# Patient Record
Sex: Male | Born: 1955 | Race: White | Hispanic: No | Marital: Married | Smoking: Never smoker
Health system: Southern US, Community
[De-identification: ages and names within clinical notes are randomized; demographics above are authoritative.]

## PROBLEM LIST (undated history)

## (undated) DIAGNOSIS — K219 Gastro-esophageal reflux disease without esophagitis: Secondary | ICD-10-CM

## (undated) DIAGNOSIS — R1013 Epigastric pain: Secondary | ICD-10-CM

## (undated) DIAGNOSIS — K449 Diaphragmatic hernia without obstruction or gangrene: Secondary | ICD-10-CM

## (undated) DIAGNOSIS — K5792 Diverticulitis of intestine, part unspecified, without perforation or abscess without bleeding: Secondary | ICD-10-CM

---
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
## 1989-01-23 HISTORY — PX: KNEE SURGERY: SHX244

## 2005-08-19 ENCOUNTER — Ambulatory Visit: Payer: Self-pay | Admitting: Family Medicine

## 2007-06-09 ENCOUNTER — Emergency Department: Payer: Self-pay | Admitting: Emergency Medicine

## 2008-11-04 ENCOUNTER — Emergency Department: Payer: Self-pay

## 2010-05-25 HISTORY — PX: INGUINAL HERNIA REPAIR: SHX194

## 2010-12-03 ENCOUNTER — Ambulatory Visit: Payer: Self-pay | Admitting: Surgery

## 2012-11-28 ENCOUNTER — Ambulatory Visit: Payer: Self-pay | Admitting: Orthopedic Surgery

## 2013-01-25 ENCOUNTER — Ambulatory Visit: Payer: Self-pay | Admitting: Orthopedic Surgery

## 2013-01-25 LAB — BASIC METABOLIC PANEL
Anion Gap: 5 — ABNORMAL LOW (ref 7–16)
BUN: 12 mg/dL (ref 7–18)
Calcium, Total: 9.1 mg/dL (ref 8.5–10.1)
Co2: 30 mmol/L (ref 21–32)
EGFR (Non-African Amer.): >60
Glucose: 84 mg/dL (ref 65–99)
Osmolality: 278 (ref 275–301)
Sodium: 140 mmol/L (ref 136–145)

## 2013-01-25 LAB — CBC
HCT: 40.5 % (ref 40.0–52.0)
HGB: 14 g/dL (ref 13.0–18.0)
MCH: 29.6 pg (ref 26.0–34.0)
MCHC: 34.6 g/dL (ref 32.0–36.0)
RDW: 13.3 % (ref 11.5–14.5)

## 2013-01-25 LAB — PROTIME-INR
INR: 1
Prothrombin Time: 13.5 secs (ref 11.5–14.7)

## 2013-02-01 ENCOUNTER — Ambulatory Visit: Payer: Self-pay | Admitting: Orthopedic Surgery

## 2013-05-25 HISTORY — PX: KNEE SURGERY: SHX244

## 2014-06-05 ENCOUNTER — Ambulatory Visit: Payer: Self-pay | Admitting: Surgery

## 2014-06-05 HISTORY — PX: INGUINAL HERNIA REPAIR: SUR1180

## 2014-07-19 ENCOUNTER — Ambulatory Visit: Payer: Self-pay | Admitting: Urgent Care

## 2014-09-14 NOTE — Op Note (Signed)
PATIENT NAME:  Carl Mckenzie, Carl Mckenzie MR#:  [id number] DATE OF BIRTH:  04-15-1956  DATE OF PROCEDURE:  02/01/2013  PREOPERATIVE DIAGNOSIS: Right knee medial meniscus tear.   POSTOPERATIVE DIAGNOSIS: Right knee medial meniscus tear, tear of the posterior root of the lateral meniscus, diffuse synovitis and grade 2 to 3 chondral lesion of the medial femoral condyle.   PROCEDURES: Right knee arthroscopic partial medial meniscectomy, partial lateral meniscectomy, chondroplasty of the medial femoral condyle and partial synovectomy.   SURGEON: Thornton Park, M.D.   ANESTHESIA: General.   ESTIMATED BLOOD LOSS: Minimal.   COMPLICATIONS: None.   INDICATIONS FOR PROCEDURE: The patient is a 59 year old male who has had persistent mechanical symptoms, despite conservative management. An MRI had confirmed the presence of a significant medial meniscus tear with chondromalacia of the medial femoral condyle. The patient, given his persistent symptoms has decided to proceed with arthroscopic surgery. I reviewed the risks and benefits of surgery with the patient prior to the date of surgery. He understands the risks include infection, bleeding, nerve or blood vessel injury, ligament injury, knee stiffness, persistent pain or instability, osteoarthritis, and the need for further surgery. Medical risks include, but are not limited to DVT and pulmonary embolism, myocardial infarction, stroke, pneumonia, respiratory failure and death. The patient understood these risks and wished to proceed.   PROCEDURE: The patient was met in the preoperative area. I marked the right knee within the operative field according to hospital's right site protocol. This was after verbally confirming with the patient that this was the correct site of surgery. I also referred to my office notes and the patient's radiographic studies to confirm the side of surgery.    The patient was then brought to the operating room. He was placed supine on the  operative table. All bony prominences were adequately padded. He underwent general anesthesia. The patient was then prepped and draped in sterile fashion.   A timeout was performed to verify the patient's name, date of birth, medical record number, correct site of surgery, and correct procedure to be performed. It was also used to verify the patient had received antibiotics and all appropriate instruments, implants, and radiographic studies were available in the room. Once all in attendance were in agreement, the case began.   Examination under anesthesia revealed no ligamentous laxity on both Lachman's test, anterior posterior drawer testing or varus or valgus stress testing at 0 and 30 degrees of knee flexion. He had a full range of motion from 0 to 130 degrees.   The proposed arthroscopy incisions were drawn out with a surgical marker and pre- injected with 1% lidocaine plain. Both the inferior medial and lateral incisions were made with an 11 blade. The inferomedial portal was made under direct visualization after the arthroscope was placed into the inferolateral portal.   A full diagnostic examination of the knee was undertaken. This included the suprapatellar pouch, patellofemoral joint, medial and lateral gutters, as well as medial and lateral compartments, the intercondylar notch and the posterior knee. Findings on arthroscopy included a complex tear of the posterior horn of the medial meniscus, grade 2 to 3 lesion of the medial femoral condyle, extensive synovitis throughout the knee, as well as a tear, without avulsion, of the lateral posterior root of the meniscus.   A straight basket as well as 4 - 0 resector shaver blade were used to debride the medial horn of the meniscus. The 4-0  resector shaver blade was used to contour the meniscus following debridement to  avoid any sharp angles which may be in location for further tear. Once the medial meniscus was addressed, the patient's leg was placed  in a figure four position to allow for better access to the posterior root of the lateral meniscus. This too was debrided using a 4 - 0 resector shaver blade and straight basket. The tear was removed without detaching the posterior root of the lateral meniscus. There was fraying of the inner margin of the lateral meniscus as well which was debrided and contoured with a 4 - 0 resector shaver blade.   The patient had extensive synovitis which was debrided with a 90 degree ArthroCare wand. This included both the medial and lateral anterior knee as well as the gutters and suprapatellar pouch.  Finally, the medial femoral condyle underwent a chondroplasty to remove all loose piece of cartilage to avoid chondral loose bodies. The knee was then copiously irrigated. All arthroscopic instruments were removed. The patient had 4-0 nylon sutures placed in his arthroscopy incisions. A dry sterile and compressive dressing was placed over the right knee. He was then transferred to a hospital stretcher and brought to the PAC-U in stable condition. I was scrubbed and present for the entire case and all sharp and instrument counts were correct at the conclusion of the case. I spoke with the patient's wife following surgery to let her know the case had gone without complication. The patient was stable in the recovery room.   ____________________________ Kevin L. Krasinski, MD klk:sg D: 02/13/2013 08:37:18 ET T: 02/13/2013 09:06:47 ET JOB#: [id number]  cc: Kevin L. Krasinski, MD, <Dictator> KEVIN L KRASINSKI MD ELECTRONICALLY SIGNED 02/13/2013 18:10 

## 2014-09-23 NOTE — Op Note (Signed)
PATIENT NAME:  Carl Mckenzie, Carl Mckenzie MR#:  [id number] DATE OF BIRTH:  1955-11-17  DATE OF PROCEDURE:  06/05/2014  PREOPERATIVE DIAGNOSIS: Left inguinal hernia.   POSTOPERATIVE DIAGNOSIS: Left inguinal hernia.   PROCEDURE: Left inguinal hernia repair.   SURGEON: Rochel Brome, MD   ANESTHESIA: General.   INDICATIONS: This 59 year old male has history of bulging in the left groin. An inguinal hernia was demonstrated on physical exam and repair was recommended for definitive treatment.   DESCRIPTION OF PROCEDURE: The patient was placed on the operating table in the supine position under general anesthesia. The left lower abdomen was clipped and prepared with ChloraPrep and draped in a sterile manner.   A transversely oriented left lower quadrant suprapubic incision was made and carried down through subcutaneous tissues. Two traversing veins were divided between 4-0 chromic suture ligatures. Scarpa's fascia was incised. The external oblique aponeurosis was incised along the course of its fibers to open the external ring and expose the inguinal cord structures. The cord structures were mobilized. The cremaster fibers were spread to expose an indirect hernia sac, which was some 4-5 cm in length, and was dissected free from surrounding structures up to the internal ring. It was opened. Its continuity with the peritoneal cavity was demonstrated. A high ligation of the sac was done with a 4-0 Vicryl suture ligature and the sac was excised and the stump allowed to retract. Next, there was a cord lipoma which was approximately 5 cm in dimension which was dissected free from surrounding structures and followed up to the internal ring. A high ligation of the cord lipoma was done with a 0 Surgilon suture ligature and the lipoma was amputated. No tissues were submitted for pathology. The repair was carried out with a row of 0 Surgilon sutures beginning at the pubic tubercle and leading up to satisfactory narrowing of the  internal ring suturing the conjoined tendon to the shelving edge of the inguinal ligament incorporating transversalis fascia into the repair. A relaxing incision was made medially. An onlay Bard soft mesh was cut to create an oval shape of some 3 x 6 cm with a notch cut out to straddle the internal ring, and was sutured to the repair with interrupted 0 Surgilon,  sutured on both sides of the internal ring, and also medially and to the medial aspect of the relaxing incision. Hemostasis appeared to be intact. The cut edges of the external oblique aponeurosis were closed with a running 4-0 Vicryl suture. The deep fascia superior and lateral to the repair site was infiltrated with 0.5% Sensorcaine with epinephrine. Subcutaneous tissues were infiltrated. The Scarpa's fascia was closed with 4-0 Vicryl and 4-0 Monocryl. The skin was closed with running 4-0 Monocryl subcuticular suture and LiquiBand. The testicle remained in the scrotum. The patient appeared to tolerate the procedure satisfactorily and was then prepared for transfer to the recovery room.    ____________________________ Lenna Sciara. Rochel Brome, MD jws:bm D: 06/05/2014 16:23:55 ET T: 06/06/2014 00:42:14 ET JOB#: [id number]  cc: Loreli Dollar, MD, <Dictator> Loreli Dollar MD ELECTRONICALLY SIGNED 06/09/2014 10:42

## 2014-10-12 NOTE — Discharge Instructions (Signed)

## 2014-10-15 ENCOUNTER — Ambulatory Visit: Payer: PRIVATE HEALTH INSURANCE | Admitting: Student in an Organized Health Care Education/Training Program

## 2014-10-15 ENCOUNTER — Encounter
Admission: RE | Disposition: A | Discharge status: Home / Self Care | Payer: Self-pay | Source: Ambulatory Visit | Attending: Gastroenterology

## 2014-10-15 ENCOUNTER — Ambulatory Visit
Admission: RE | Admit: 2014-10-15 | Discharge: 2014-10-15 | Disposition: A | Discharge status: Home / Self Care | Payer: PRIVATE HEALTH INSURANCE | Source: Ambulatory Visit | Attending: Gastroenterology | Admitting: Gastroenterology

## 2014-10-15 ENCOUNTER — Other Ambulatory Visit: Payer: Self-pay | Admitting: Gastroenterology

## 2014-10-15 DIAGNOSIS — K449 Diaphragmatic hernia without obstruction or gangrene: Secondary | ICD-10-CM | POA: Insufficient documentation

## 2014-10-15 DIAGNOSIS — K2980 Duodenitis without bleeding: Secondary | ICD-10-CM | POA: Insufficient documentation

## 2014-10-15 DIAGNOSIS — K319 Disease of stomach and duodenum, unspecified: Secondary | ICD-10-CM | POA: Insufficient documentation

## 2014-10-15 DIAGNOSIS — K219 Gastro-esophageal reflux disease without esophagitis: Secondary | ICD-10-CM | POA: Diagnosis not present

## 2014-10-15 DIAGNOSIS — Z9889 Other specified postprocedural states: Secondary | ICD-10-CM | POA: Diagnosis not present

## 2014-10-15 DIAGNOSIS — K2970 Gastritis, unspecified, without bleeding: Secondary | ICD-10-CM | POA: Diagnosis not present

## 2014-10-15 DIAGNOSIS — Z7982 Long term (current) use of aspirin: Secondary | ICD-10-CM | POA: Insufficient documentation

## 2014-10-15 DIAGNOSIS — R1013 Epigastric pain: Secondary | ICD-10-CM | POA: Diagnosis present

## 2014-10-15 HISTORY — DX: Diverticulitis of intestine, part unspecified, without perforation or abscess without bleeding: K57.92

## 2014-10-15 HISTORY — DX: Gastro-esophageal reflux disease without esophagitis: K21.9

## 2014-10-15 HISTORY — DX: Diaphragmatic hernia without obstruction or gangrene: K44.9

## 2014-10-15 HISTORY — DX: Epigastric pain: R10.13

## 2014-10-15 HISTORY — PX: ESOPHAGOGASTRODUODENOSCOPY: SHX5428

## 2014-10-15 SURGERY — EGD (ESOPHAGOGASTRODUODENOSCOPY)
Anesthesia: Choice

## 2014-10-15 SURGERY — EGD (ESOPHAGOGASTRODUODENOSCOPY)
Anesthesia: Monitor Anesthesia Care | Wound class: Clean Contaminated

## 2014-10-15 MED ORDER — GLYCOPYRROLATE 0.2 MG/ML IJ SOLN
INTRAMUSCULAR | Status: DC | PRN
  Administered 2014-10-15: 0.1 mg via INTRAVENOUS

## 2014-10-15 MED ORDER — PROPOFOL 10 MG/ML IV BOLUS
INTRAVENOUS | Status: DC | PRN
  Administered 2014-10-15: 50 mg via INTRAVENOUS
  Administered 2014-10-15: 150 mg via INTRAVENOUS

## 2014-10-15 MED ORDER — LIDOCAINE HCL (CARDIAC) 20 MG/ML IV SOLN
INTRAVENOUS | Status: DC | PRN
  Administered 2014-10-15: 50 mg via INTRAVENOUS

## 2014-10-15 MED ORDER — LACTATED RINGERS IV SOLN
INTRAVENOUS | Status: DC
  Administered 2014-10-15 (×2): via INTRAVENOUS

## 2014-10-15 SURGICAL SUPPLY — 38 items
BALLN DILATOR 10-12 8 (BALLOONS)
BALLN DILATOR 12-15 8 (BALLOONS)
BALLN DILATOR 15-18 8 (BALLOONS)
BALLN DILATOR CRE 0-12 8 (BALLOONS)
BALLN DILATOR ESOPH 8 10 CRE (MISCELLANEOUS) IMPLANT
BALLOON DILATOR 12-15 8 (BALLOONS) IMPLANT
BALLOON DILATOR 15-18 8 (BALLOONS) IMPLANT
BALLOON DILATOR CRE 0-12 8 (BALLOONS) IMPLANT
BLOCK BITE 60FR ADLT L/F GRN (MISCELLANEOUS) ×3 IMPLANT
CANISTER SUCT 1200ML W/VALVE (MISCELLANEOUS) ×3 IMPLANT
FCP ESCP3.2XJMB 240X2.8X (MISCELLANEOUS)
FORCEPS BIOP RAD 4 LRG CAP 4 (CUTTING FORCEPS) ×3 IMPLANT
FORCEPS BIOP RJ4 240 W/NDL (MISCELLANEOUS)
FORCEPS ESCP3.2XJMB 240X2.8X (MISCELLANEOUS) IMPLANT
GOWN CVR UNV OPN BCK APRN NK (MISCELLANEOUS) ×2 IMPLANT
GOWN ISOL THUMB LOOP REG UNIV (MISCELLANEOUS) ×4
HEMOCLIP INSTINCT (CLIP) IMPLANT
INJECTOR VARIJECT VIN23 (MISCELLANEOUS) IMPLANT
KIT CO2 TUBING (TUBING) IMPLANT
KIT DEFENDO VALVE AND CONN (KITS) IMPLANT
KIT ENDO PROCEDURE OLY (KITS) ×3 IMPLANT
LIGATOR MULTIBAND 6SHOOTER MBL (MISCELLANEOUS) IMPLANT
MARKER SPOT ENDO TATTOO 5ML (MISCELLANEOUS) IMPLANT
PAD GROUND ADULT SPLIT (MISCELLANEOUS) IMPLANT
SNARE SHORT THROW 13M SML OVAL (MISCELLANEOUS) IMPLANT
SNARE SHORT THROW 30M LRG OVAL (MISCELLANEOUS) IMPLANT
SPOT EX ENDOSCOPIC TATTOO (MISCELLANEOUS)
SUCTION POLY TRAP 4CHAMBER (MISCELLANEOUS) IMPLANT
SYR INFLATION 60ML (SYRINGE) IMPLANT
TRAP SUCTION POLY (MISCELLANEOUS) IMPLANT
TUBING CONN 6MMX3.1M (TUBING)
TUBING SUCTION CONN 0.25 STRL (TUBING) IMPLANT
UNDERPAD 30X60 958B10 (PK) (MISCELLANEOUS) IMPLANT
VALVE BIOPSY ENDO (VALVE) IMPLANT
VARIJECT INJECTOR VIN23 (MISCELLANEOUS)
WATER AUXILLARY (MISCELLANEOUS) IMPLANT
WATER STERILE IRR 500ML POUR (IV SOLUTION) ×3 IMPLANT
WIRE CRE 18-20MM 8CM F G (MISCELLANEOUS) IMPLANT

## 2014-10-15 NOTE — Anesthesia Preprocedure Evaluation (Signed)
Anesthesia Evaluation  Patient identified by MRN, date of birth, ID band Patient awake    Reviewed: Allergy & Precautions, NPO status , Patient's Chart, lab work & pertinent test results  Airway Mallampati: II  TM Distance: >3 FB Neck ROM: Full    Dental   Pulmonary    Pulmonary exam normal       Cardiovascular Normal cardiovascular exam    Neuro/Psych    GI/Hepatic hiatal hernia, GERD-  ,  Endo/Other    Renal/GU      Musculoskeletal   Abdominal   Peds  Hematology   Anesthesia Other Findings   Reproductive/Obstetrics                             Anesthesia Physical Anesthesia Plan  ASA: II  Anesthesia Plan: MAC   Post-op Pain Management:    Induction: Intravenous  Airway Management Planned: Nasal Cannula  Additional Equipment:   Intra-op Plan:   Post-operative Plan:   Informed Consent: I have reviewed the patients History and Physical, chart, labs and discussed the procedure including the risks, benefits and alternatives for the proposed anesthesia with the patient or authorized representative who has indicated his/her understanding and acceptance.     Plan Discussed with: CRNA  Anesthesia Plan Comments:         Anesthesia Quick Evaluation

## 2014-10-15 NOTE — Op Note (Signed)
[facility] Gastroenterology Patient Name: Carl Mckenzie Procedure Date: 10/15/2014 9:26 AM MRN: [id number] Account #: [id number] Date of Birth: Jan 22, 1956 Admit Type: Outpatient Age: 59 Room: [facility] OR ROOM 01 Gender: Male Note Status: Finalized Procedure:         Upper GI endoscopy Indications:       Epigastric abdominal pain Providers:         Lucilla Lame, MD Referring MD:      Shepard General, MD (Referring MD) Medicines:         Propofol per Anesthesia Complications:     No immediate complications. Procedure:         Pre-Anesthesia Assessment:                    - Prior to the procedure, a History and Physical was                     performed, and patient medications and allergies were                     reviewed. The patient's tolerance of previous anesthesia                     was also reviewed. The risks and benefits of the procedure                     and the sedation options and risks were discussed with the                     patient. All questions were answered, and informed consent                     was obtained. Prior Anticoagulants: The patient has taken                     no previous anticoagulant or antiplatelet agents. ASA                     Grade Assessment: II - A patient with mild systemic                     disease. After reviewing the risks and benefits, the                     patient was deemed in satisfactory condition to undergo                     the procedure.                    After obtaining informed consent, the endoscope was passed                     under direct vision. Throughout the procedure, the                     patient's blood pressure, pulse, and oxygen saturations                     were monitored continuously. The Olympus GIF H180J                     endoscope (S#: [id number]) was introduced through the mouth,  and advanced to the second part of duodenum. The upper GI   endoscopy was accomplished without difficulty. The patient                     tolerated the procedure well. Findings:      The examined esophagus was normal.      Localized moderate inflammation characterized by erythema was found in       the gastric antrum. Biopsies were taken with a cold forceps for       histology.      Localized mild inflammation characterized by erythema was found in the       duodenal bulb. Impression:        - Normal esophagus.                    - Gastritis. Biopsied.                    - Duodenitis. Recommendation:    - Await pathology results. Procedure Code(s): --- Professional ---                    [phone], Esophagogastroduodenoscopy, flexible, transoral;                     with biopsy, single or multiple Diagnosis Code(s): --- Professional ---                    R10.13, Epigastric pain                    K29.70, Gastritis, unspecified, without bleeding                    K29.80, Duodenitis without bleeding CPT copyright 2014 American Medical Association. All rights reserved. The codes documented in this report are preliminary and upon coder review may  be revised to meet current compliance requirements. Lucilla Lame, MD 10/15/2014 9:43:31 AM This report has been signed electronically. Number of Addenda: 0 Note Initiated On: 10/15/2014 9:26 AM      [facility]

## 2014-10-15 NOTE — Anesthesia Procedure Notes (Signed)
Procedure Name: MAC Performed by: Jaquane Boughner Pre-anesthesia Checklist: Patient identified, Emergency Drugs available, Suction available, Patient being monitored and Timeout performed Patient Re-evaluated:Patient Re-evaluated prior to inductionOxygen Delivery Method: Nasal cannula Placement Confirmation: positive ETCO2 and breath sounds checked- equal and bilateral     

## 2014-10-15 NOTE — Transfer of Care (Signed)
Immediate Anesthesia Transfer of Care Note  Patient: Carl Mckenzie  Procedure(s) Performed: Procedure(s): ESOPHAGOGASTRODUODENOSCOPY (EGD) (N/A)  Patient Location: PACU  Anesthesia Type: MAC  Level of Consciousness: awake, alert  and patient cooperative  Airway and Oxygen Therapy: Patient Spontanous Breathing and Patient connected to supplemental oxygen  Post-op Assessment: Post-op Vital signs reviewed, Patient's Cardiovascular Status Stable, Respiratory Function Stable, Patent Airway and No signs of Nausea or vomiting  Post-op Vital Signs: Reviewed and stable  Complications: No apparent anesthesia complications

## 2014-10-15 NOTE — H&P (Signed)
  [facility]  [address], [place], [place] [address] Phone: [phone] Fax : [phone]  Primary Care Physician:  Everlean Alstrom, MD Primary Gastroenterologist:  Dr. Allen Norris  Pre-Procedure History & Physical: HPI:  Carl Mckenzie is a 59 y.o. male is here for an endoscopy.   Past Medical History  Diagnosis Date  . Epigastric pain     CHRONIC  . Hiatal hernia   . GERD (gastroesophageal reflux disease)   . Diverticulitis     Past Surgical History  Procedure Laterality Date  . Knee surgery Right 2015  . Shoulder surgery Left 1970'S  . Knee surgery Left 1990'S  . Inguinal hernia repair Right 2012  . Inguinal hernia repair Left 06/05/2014    Prior to Admission medications   Medication Sig Start Date End Date Taking? Authorizing Provider  aspirin 81 MG tablet Take 81 mg by mouth daily. PM   Yes Historical Provider, MD    Allergies as of 09/03/2014  . (Not on File)    History reviewed. No pertinent family history.  History   Social History  . Marital Status: Married    Spouse Name: N/A  . Number of Children: N/A  . Years of Education: N/A   Occupational History  . Not on file.   Social History Main Topics  . Smoking status: Never Smoker   . Smokeless tobacco: Not on file  . Alcohol Use: 1.2 oz/week    2 Cans of beer per week  . Drug Use: No  . Sexual Activity: Not on file   Other Topics Concern  . Not on file   Social History Narrative    Review of Systems: See HPI, otherwise negative ROS  Physical Exam: BP 131/82 mmHg  Pulse 45  Temp(Src) 97.7 F (36.5 C)  Ht 5\' 10"  (1.778 m)  Wt 196 lb (88.905 kg)  BMI 28.12 kg/m2  SpO2 100% General:   Alert,  pleasant and cooperative in NAD Head:  Normocephalic and atraumatic. Neck:  Supple; no masses or thyromegaly. Lungs:  Clear throughout to auscultation.    Heart:  Regular rate and rhythm. Abdomen:  Soft, nontender and nondistended. Normal bowel sounds, without guarding, and  without rebound.   Neurologic:  Alert and  oriented x4;  grossly normal neurologically.  Impression/Plan: Carl Mckenzie is here for an endoscopy to be performed for epigastric pain  Risks, benefits, limitations, and alternatives regarding  endoscopy have been reviewed with the patient.  Questions have been answered.  All parties agreeable.   [facility], MD  10/15/2014, 9:31 AM

## 2014-10-15 NOTE — Anesthesia Postprocedure Evaluation (Signed)
  Anesthesia Post-op Note  Patient: Carl Mckenzie  Procedure(s) Performed: Procedure(s): ESOPHAGOGASTRODUODENOSCOPY (EGD) (N/A)  Anesthesia type:MAC  Patient location: PACU  Post pain: Pain level controlled  Post assessment: Post-op Vital signs reviewed, Patient's Cardiovascular Status Stable, Respiratory Function Stable, Patent Airway and No signs of Nausea or vomiting  Post vital signs: Reviewed and stable  Last Vitals:  Filed Vitals:   10/15/14 0945  BP:   Pulse:   Temp: 36.7 C  Resp: 14    Level of consciousness: awake, alert  and patient cooperative  Complications: No apparent anesthesia complications

## 2014-10-16 ENCOUNTER — Encounter: Payer: Self-pay | Admitting: Gastroenterology

## 2015-04-16 ENCOUNTER — Ambulatory Visit: Payer: Self-pay | Admitting: Physician Assistant

## 2015-04-16 ENCOUNTER — Encounter: Payer: Self-pay | Admitting: Physician Assistant

## 2015-04-16 VITALS — BP 130/70 | HR 53 | Temp 98.7°F

## 2015-04-16 DIAGNOSIS — J069 Acute upper respiratory infection, unspecified: Secondary | ICD-10-CM

## 2015-04-16 MED ORDER — AZITHROMYCIN 250 MG PO TABS: ORAL_TABLET | ORAL | Status: AC

## 2015-04-16 NOTE — Progress Notes (Signed)
S: C/o runny nose and congestion for 10 days, no fever, chills, cp/sob, v/d; mucus was green this am but clear/yellow throughout the day, cough is sporadic,   Using otc meds: mucinex  O: PE: perrl eomi, normocephalic, tms dull, nasal mucosa red and swollen, throat injected, neck supple no lymph, lungs c t a, cv rrr, neuro intact  A:  Acute uri   P: zpack, drink fluids, continue regular meds , use otc meds of choice, return if not improving in 5 days, return earlier if worsening

## 2015-05-08 ENCOUNTER — Ambulatory Visit: Payer: Self-pay | Admitting: Physician Assistant

## 2015-05-08 DIAGNOSIS — Z299 Encounter for prophylactic measures, unspecified: Secondary | ICD-10-CM

## 2015-05-08 NOTE — Progress Notes (Signed)
Patient ID: Carl Mckenzie, male   DOB: 1956/03/24, 59 y.o.   MRN: [id number] Patient came in to have flu shot done here in the clinic.  Patient stayed for 10 minutes and had no adverse reaction.

## 2015-05-15 ENCOUNTER — Other Ambulatory Visit: Payer: Self-pay

## 2016-02-14 ENCOUNTER — Telehealth: Payer: Self-pay | Admitting: General Practice

## 2016-02-14 NOTE — Telephone Encounter (Signed)
Pt wife called to request pt see Dr Rosanna Randy.  Pt chest congestion and cough. Pt does not have a PCP.  [facility].  No Rx.  Can I schedule a new patient appointment?  CB#[phone]/MW

## 2016-02-14 NOTE — Telephone Encounter (Signed)
Please schedule.-aa 

## 2016-02-14 NOTE — Telephone Encounter (Signed)
I called and spoke to pt.  Pt states he will need to call back at a later date to set up an appointment/MW

## 2016-02-14 NOTE — Telephone Encounter (Signed)
Please review-aa 

## 2016-02-14 NOTE — Telephone Encounter (Signed)
Happy to see him. He is an old patient, just has not been seen in 3 or 4 years.

## 2016-04-04 ENCOUNTER — Ambulatory Visit (INDEPENDENT_AMBULATORY_CARE_PROVIDER_SITE_OTHER): Payer: BLUE CROSS/BLUE SHIELD

## 2016-04-04 DIAGNOSIS — Z23 Encounter for immunization: Secondary | ICD-10-CM | POA: Diagnosis not present

## 2016-08-17 ENCOUNTER — Other Ambulatory Visit: Payer: Self-pay | Admitting: Family Medicine

## 2016-08-17 DIAGNOSIS — Z20828 Contact with and (suspected) exposure to other viral communicable diseases: Secondary | ICD-10-CM

## 2016-08-17 MED ORDER — OSELTAMIVIR PHOSPHATE 75 MG PO CAPS: 75.0000 mg | ORAL_CAPSULE | Freq: Every day | ORAL | 0 refills | Status: DC

## 2016-08-17 MED ORDER — OSELTAMIVIR PHOSPHATE 75 MG PO CAPS: 75.0000 mg | ORAL_CAPSULE | Freq: Every day | ORAL | 0 refills | Status: AC

## 2016-09-22 IMAGING — US ABDOMEN ULTRASOUND
1 series · 13 of 25 positions shown · non-contrast
Comparison: Abdominal CT scan June 10, 2007

CLINICAL DATA: Intermittent epigastric pain with progressive
symptoms over the past 4 months

EXAM:
ULTRASOUND ABDOMEN COMPLETE

[Series 1: abdomen ultrasound · 0.23mm/px · 13 of 88 slices shown]
[im 1/88]
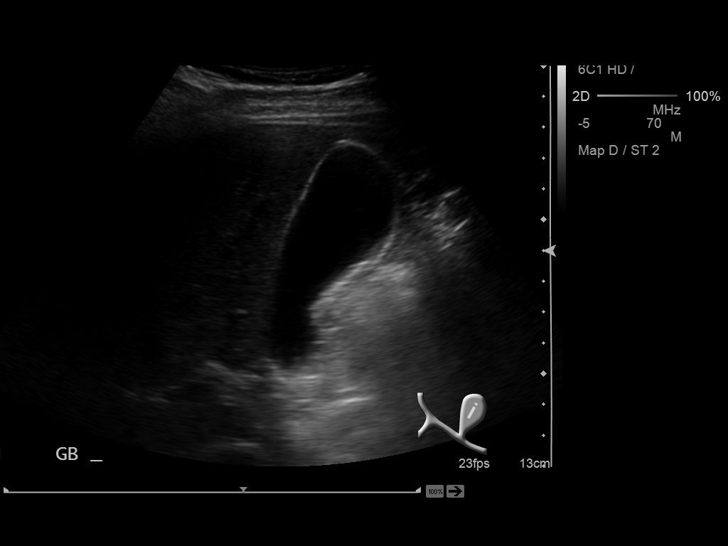
[im 8/88]
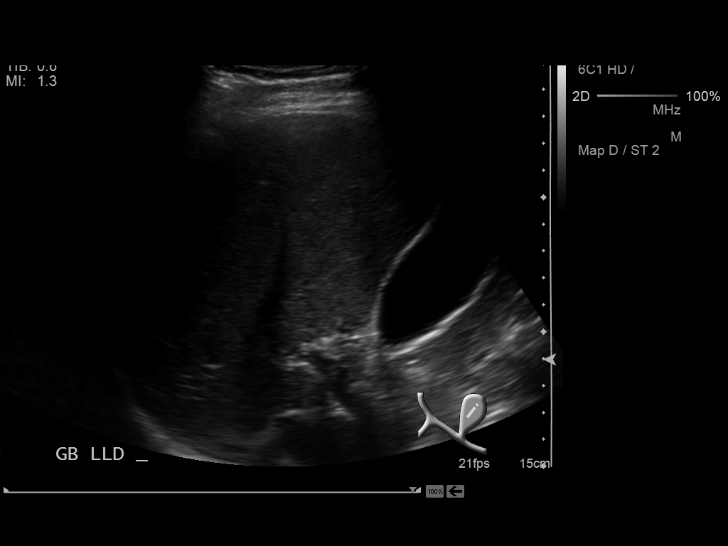
[im 15/88]
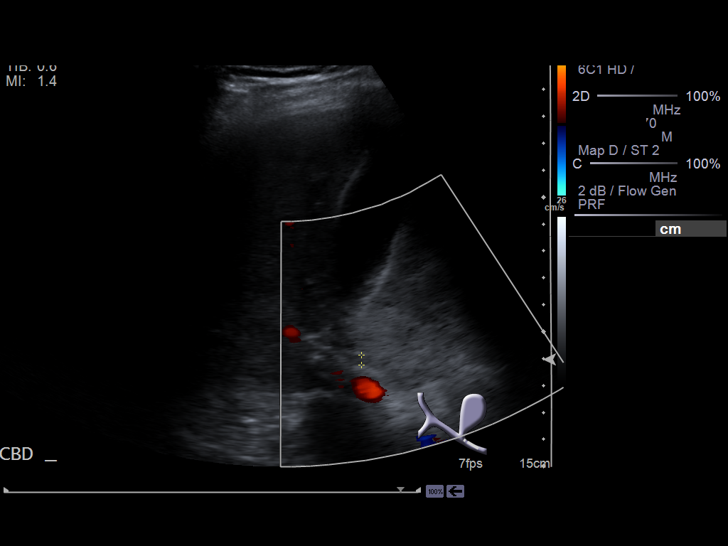
[im 22/88]
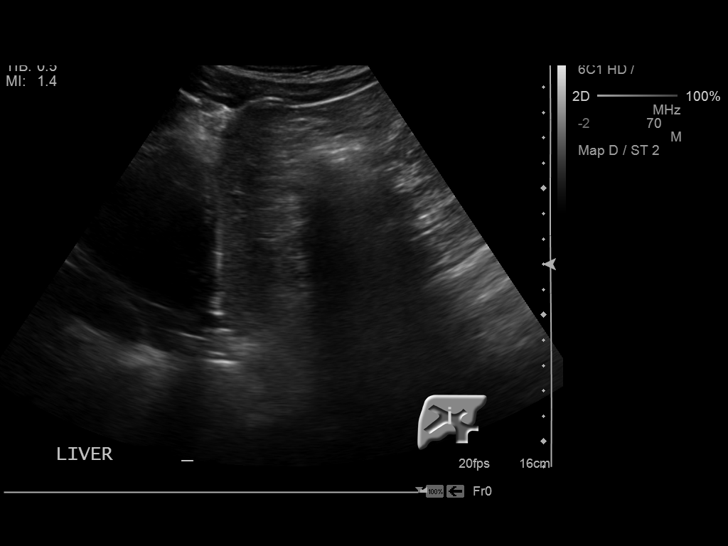
[im 30/88]
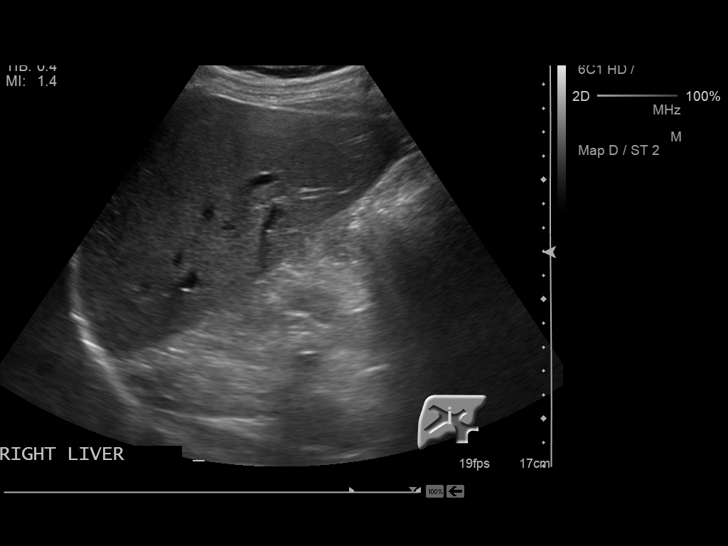
[im 37/88]
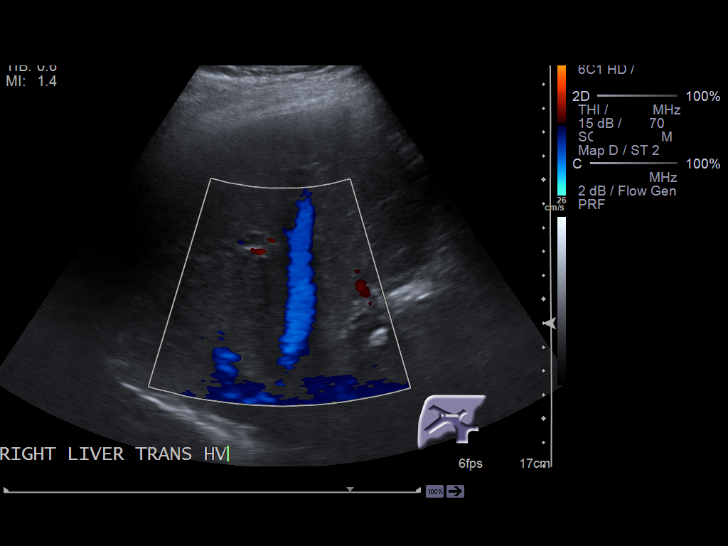
[im 44/88]
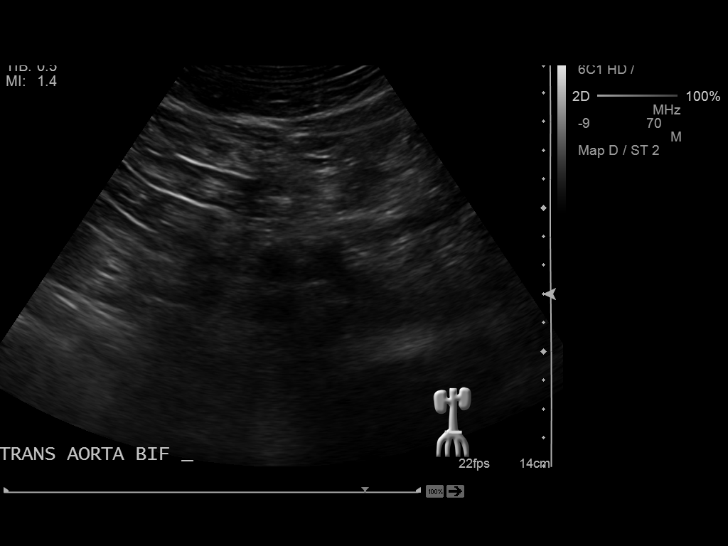
[im 51/88]
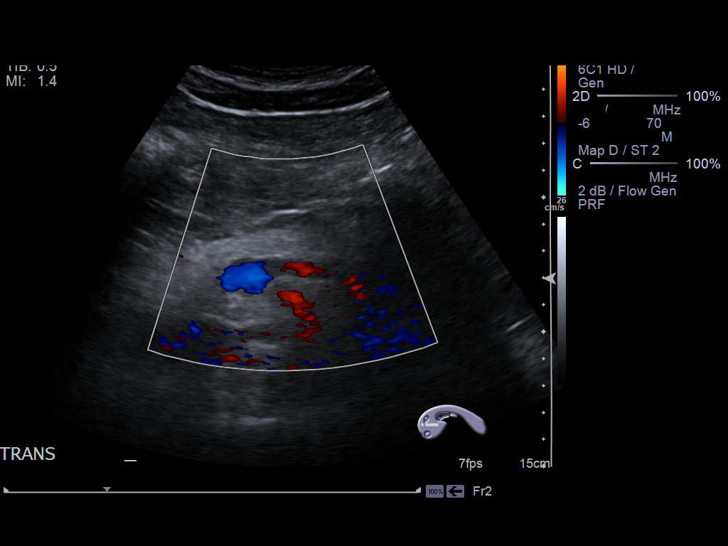
[im 59/88]
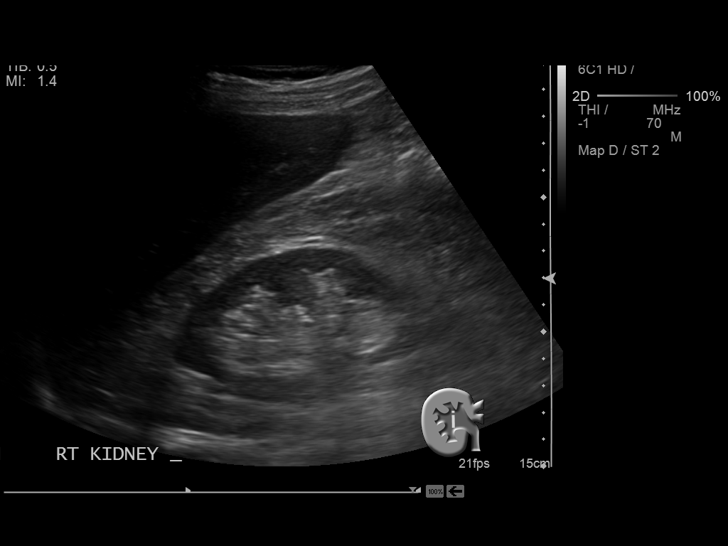
[im 66/88]
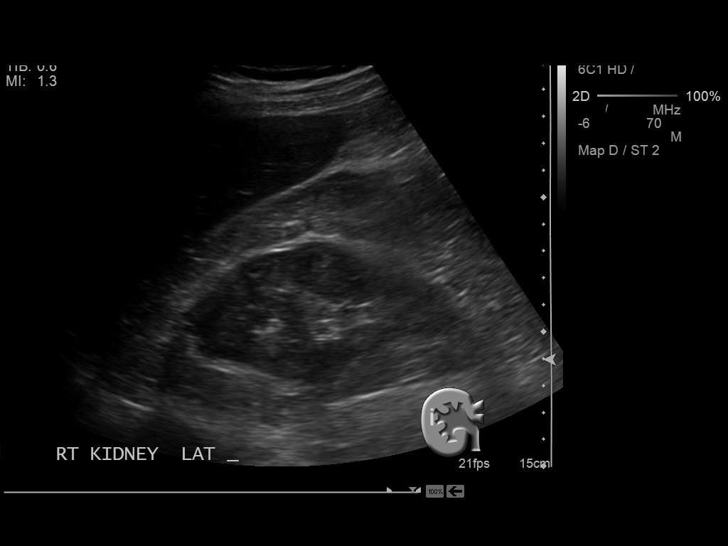
[im 73/88]
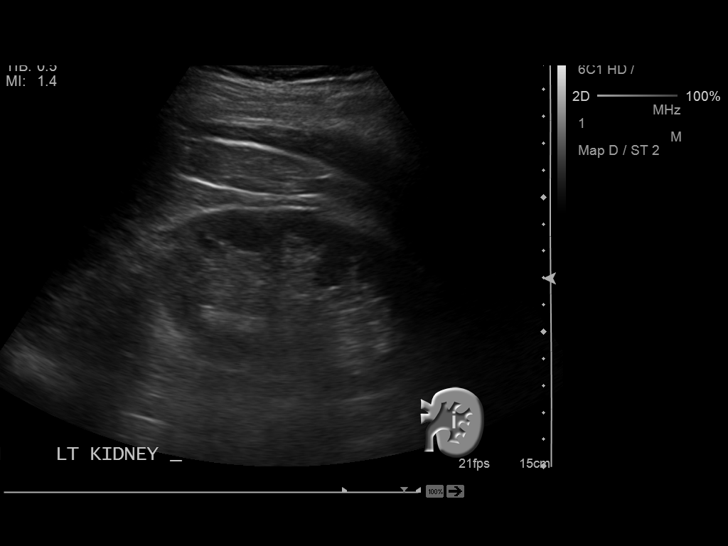
[im 80/88]
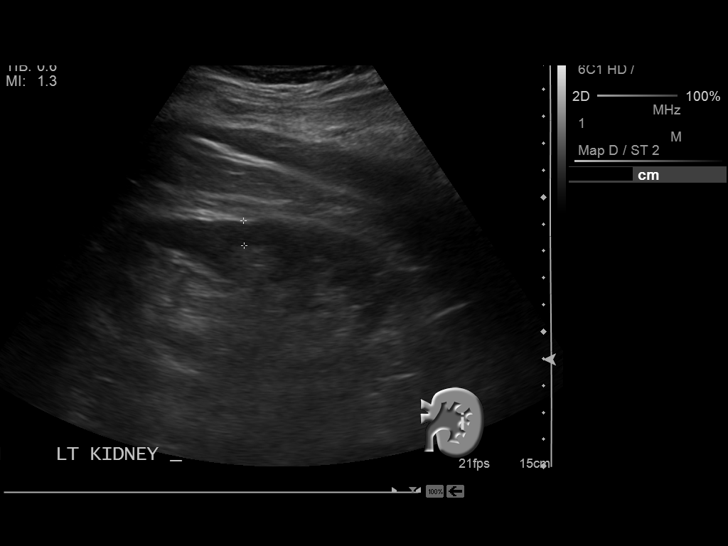
[im 88/88]
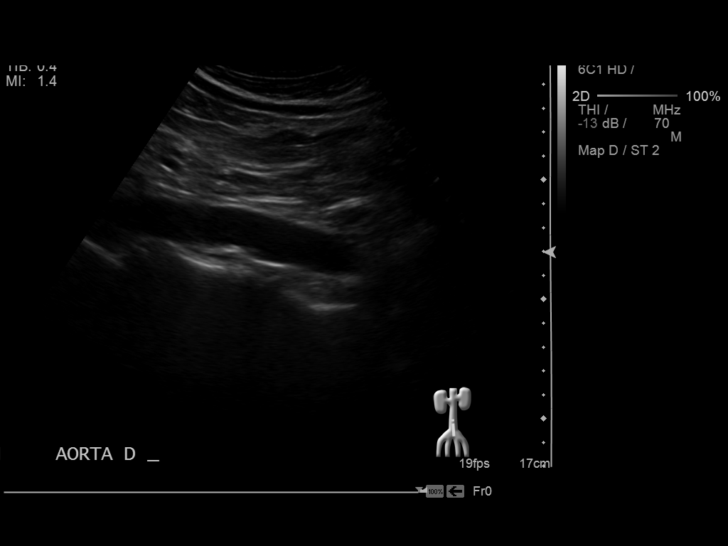

[13 of 25 positions shown; findings below may reference images not displayed]

FINDINGS: Gallbladder: No gallstones or wall thickening visualized. No
sonographic Murphy sign noted.

Common bile duct: Diameter: 3.7 mm

Liver: No focal lesion identified. Within normal limits in
parenchymal echogenicity.

IVC: No abnormality visualized.

Pancreas: The pancreatic head and body are normal where visualized.
The pancreatic tail is obscured by bowel gas.

Spleen: Size and appearance within normal limits.

Right Kidney: Length: 10.4 cm. The renal cortical echotexture is
approximately equal to that of the adjacent liver. There is mild
cortical thinning. No mass or hydronephrosis visualized.

Left Kidney: Length: 11.0 cm. Echogenicity is mildly increased. No
mass or hydronephrosis visualized.

Abdominal aorta: No aneurysm visualized.

Other findings: No ascites is demonstrated.
IMPRESSION: 1. There is no acute hepatobiliary abnormality. The pancreatic tail
was obscured by bowel gas.
2. Mildly increased renal cortical echotexture may reflect medical
renal disease. There is mild cortical thinning. There is no evidence
of obstruction.

## 2017-04-30 ENCOUNTER — Ambulatory Visit: Payer: BLUE CROSS/BLUE SHIELD | Admitting: Physician Assistant

## 2018-12-29 ENCOUNTER — Telehealth: Payer: Self-pay

## 2018-12-29 NOTE — Telephone Encounter (Signed)
Patient would like to get reestablished with Mr.Chrismon as his provider. Needs a physical and labs. Please speak with wife Elzie Rings [phone].

## 2018-12-30 NOTE — Telephone Encounter (Signed)
May schedule in early September. I will be out of town the next 1 1/2 weeks.

## 2018-12-30 NOTE — Telephone Encounter (Signed)
Please review

## 2018-12-30 NOTE — Telephone Encounter (Signed)
Patient needed to get in sooner. He has established care with another provider per wife.

## 2019-01-04 ENCOUNTER — Telehealth: Payer: Self-pay

## 2019-01-04 NOTE — Telephone Encounter (Signed)
Patient states that he no longer wants an appointment.

## 2019-01-04 NOTE — Telephone Encounter (Signed)
LMTCB 01/04/2019  Thanks,   -Mickel Baas

## 2019-01-04 NOTE — Telephone Encounter (Signed)
-----   Message from Jerrol Banana., MD sent at 01/04/2019  3:34 PM EDT ----- Regarding: RE: New Pt Contact: [phone] I am happy to see Carl Mckenzie--great guy ----- Message ----- From: Kizzie Furnish, CMA Sent: 01/04/2019   8:32 AM EDT To: Jerrol Banana., MD Subject: Melton Alar: New Pt                                      ----- Message ----- From: Dorothy Spark Sent: 12/28/2018  11:41 AM EDT To: Rosanna Randy Nurse Subject: New Pt                                         Pt's wife Carl Mckenzie stated that their entire family sees Dr. Rosanna Randy and they are asking if Carl Mckenzie can re-establish with Dr. Rosanna Randy. Other than pt getting a flu shot here in 2017 pt hasn't been seen since 2011. Pt was scheduled to re-establish with Adriana in 2018 but canceled the appt. Can we re-establish with Dr. Rosanna Randy? CB#[phone]. Pt has [employer] for insurance. Wife stated pt has been health and hasn't seen another PCP. Please advise. Thanks TNP

## 2019-06-22 ENCOUNTER — Ambulatory Visit: Admit: 2019-06-22 | Payer: PRIVATE HEALTH INSURANCE | Admitting: Internal Medicine

## 2019-06-22 SURGERY — COLONOSCOPY WITH PROPOFOL
Anesthesia: General

## 2019-09-18 ENCOUNTER — Other Ambulatory Visit: Payer: Self-pay

## 2019-09-18 ENCOUNTER — Other Ambulatory Visit
Admission: RE | Admit: 2019-09-18 | Discharge: 2019-09-18 | Disposition: A | Discharge status: Home / Self Care | Payer: 59 | Source: Ambulatory Visit | Attending: Internal Medicine | Admitting: Internal Medicine

## 2019-09-18 DIAGNOSIS — Z20822 Contact with and (suspected) exposure to covid-19: Secondary | ICD-10-CM | POA: Insufficient documentation

## 2019-09-18 DIAGNOSIS — Z01812 Encounter for preprocedural laboratory examination: Secondary | ICD-10-CM | POA: Insufficient documentation

## 2019-09-18 LAB — SARS CORONAVIRUS 2 (TAT 6-24 HRS): SARS Coronavirus 2: NEGATIVE

## 2019-09-20 ENCOUNTER — Encounter
Admission: RE | Disposition: A | Discharge status: Home / Self Care | Payer: Self-pay | Source: Home / Self Care | Attending: Internal Medicine

## 2019-09-20 ENCOUNTER — Ambulatory Visit: Payer: 59 | Admitting: Anesthesiology

## 2019-09-20 ENCOUNTER — Ambulatory Visit
Admission: RE | Admit: 2019-09-20 | Discharge: 2019-09-20 | Disposition: A | Discharge status: Home / Self Care | Payer: 59 | Attending: Internal Medicine | Admitting: Internal Medicine

## 2019-09-20 DIAGNOSIS — Z7982 Long term (current) use of aspirin: Secondary | ICD-10-CM | POA: Insufficient documentation

## 2019-09-20 DIAGNOSIS — K5730 Diverticulosis of large intestine without perforation or abscess without bleeding: Secondary | ICD-10-CM | POA: Insufficient documentation

## 2019-09-20 DIAGNOSIS — Z79899 Other long term (current) drug therapy: Secondary | ICD-10-CM | POA: Diagnosis not present

## 2019-09-20 DIAGNOSIS — K640 First degree hemorrhoids: Secondary | ICD-10-CM | POA: Insufficient documentation

## 2019-09-20 DIAGNOSIS — Z1211 Encounter for screening for malignant neoplasm of colon: Secondary | ICD-10-CM | POA: Diagnosis present

## 2019-09-20 DIAGNOSIS — D123 Benign neoplasm of transverse colon: Secondary | ICD-10-CM | POA: Insufficient documentation

## 2019-09-20 HISTORY — PX: COLONOSCOPY WITH PROPOFOL: SHX5780

## 2019-09-20 SURGERY — COLONOSCOPY WITH PROPOFOL
Anesthesia: General

## 2019-09-20 MED ORDER — PROPOFOL 10 MG/ML IV BOLUS
INTRAVENOUS | Status: DC | PRN
  Administered 2019-09-20: 60 mg via INTRAVENOUS

## 2019-09-20 MED ORDER — PROPOFOL 500 MG/50ML IV EMUL
INTRAVENOUS | Status: DC | PRN
  Administered 2019-09-20: 150 ug/kg/min via INTRAVENOUS

## 2019-09-20 MED ORDER — LIDOCAINE HCL (PF) 1 % IJ SOLN
INTRAMUSCULAR | Status: AC
  Filled 2019-09-20: qty 2

## 2019-09-20 MED ORDER — SODIUM CHLORIDE 0.9 % IV SOLN
INTRAVENOUS | Status: DC
  Administered 2019-09-20: 1000 mL via INTRAVENOUS

## 2019-09-20 MED ORDER — PROPOFOL 500 MG/50ML IV EMUL
INTRAVENOUS | Status: AC
  Filled 2019-09-20: qty 50

## 2019-09-20 NOTE — H&P (Signed)
Outpatient short stay form Pre-procedure 09/20/2019 10:25 AM Aaronjames Kelsay K. Alice Reichert, M.D.  Primary Physician: Dion Body, M.D.  Reason for visit:  Colon cancer screening  History of present illness:  Patient presents for colonoscopy for colon cancer screening. The patient denies complaints of abdominal pain, significant change in bowel habits, or rectal bleeding.     No current facility-administered medications for this encounter.  Current Outpatient Medications:  .  aspirin 81 MG tablet, Take 81 mg by mouth daily. PM, Disp: , Rfl:  .  azithromycin (ZITHROMAX Z-PAK) 250 MG tablet, 2 pills today then 1 pill a day for 4 days, Disp: 6 each, Rfl: 0 .  oseltamivir (TAMIFLU) 75 MG capsule, Take 1 capsule (75 mg total) by mouth daily., Disp: 10 capsule, Rfl: 0  No medications prior to admission.     No Known Allergies   Past Medical History:  Diagnosis Date  . Diverticulitis   . Epigastric pain    CHRONIC  . GERD (gastroesophageal reflux disease)   . Hiatal hernia     Review of systems:  Otherwise negative.    Physical Exam  Gen: Alert, oriented. Appears stated age.  HEENT: [place]/AT. PERRLA. Lungs: CTA, no wheezes. CV: RR nl S1, S2. Abd: soft, benign, no masses. BS+ Ext: No edema. Pulses 2+    Planned procedures: Proceed with colonoscopy. The patient understands the nature of the planned procedure, indications, risks, alternatives and potential complications including but not limited to bleeding, infection, perforation, damage to internal organs and possible oversedation/side effects from anesthesia. The patient agrees and gives consent to proceed.  Please refer to procedure notes for findings, recommendations and patient disposition/instructions.     Kennadi Albany K. Alice Reichert, M.D. Gastroenterology 09/20/2019  10:25 AM

## 2019-09-20 NOTE — Op Note (Signed)
[facility] Gastroenterology Patient Name: Jeramih Battani Procedure Date: 09/20/2019 12:39 PM MRN: [id number] Account #: [id number] Date of Birth: 29-Aug-1955 Admit Type: Outpatient Age: 64 Room: [facility] ENDO ROOM 3 Gender: Male Note Status: Finalized Procedure:             Colonoscopy Indications:           Screening for colorectal malignant neoplasm Providers:             Benay Pike. Alice Reichert MD, MD Referring MD:          Dion Body (Referring MD) Medicines:             Propofol per Anesthesia Complications:         No immediate complications. Procedure:             Pre-Anesthesia Assessment:                        - The risks and benefits of the procedure and the                         sedation options and risks were discussed with the                         patient. All questions were answered and informed                         consent was obtained.                        - Patient identification and proposed procedure were                         verified prior to the procedure by the nurse. The                         procedure was verified in the procedure room.                        - ASA Grade Assessment: I - A normal, healthy patient.                        - After reviewing the risks and benefits, the patient                         was deemed in satisfactory condition to undergo the                         procedure.                        After obtaining informed consent, the colonoscope was                         passed under direct vision. Throughout the procedure,                         the patient's blood pressure, pulse, and oxygen                         saturations  were monitored continuously. The                         Colonoscope was introduced through the anus and                         advanced to the the cecum, identified by appendiceal                         orifice and ileocecal valve. The colonoscopy was   performed without difficulty. The patient tolerated                         the procedure well. The quality of the bowel                         preparation was adequate. The ileocecal valve,                         appendiceal orifice, and rectum were photographed. Findings:      The perianal and digital rectal examinations were normal. Pertinent       negatives include normal sphincter tone and no palpable rectal lesions.      Non-bleeding internal hemorrhoids were found during retroflexion. The       hemorrhoids were Grade I (internal hemorrhoids that do not prolapse).      A few small-mouthed diverticula were found in the sigmoid colon.      A 7 mm polyp was found in the transverse colon. The polyp was       semi-pedunculated. The polyp was removed with a cold snare. Polyp       resection was incomplete, and the resected tissue was partially       retrieved.      The exam was otherwise without abnormality. Impression:            - Non-bleeding internal hemorrhoids.                        - Diverticulosis in the sigmoid colon.                        - One 7 mm polyp in the transverse colon, removed with                         a cold snare. Polyp resection was incomplete, and the                         resected tissue was partially retrieved.                        - The examination was otherwise normal. Recommendation:        - Patient has a contact number available for                         emergencies. The signs and symptoms of potential                         delayed complications were discussed with the patient.  Return to normal activities tomorrow. Written                         discharge instructions were provided to the patient.                        - Resume previous diet.                        - Continue present medications.                        - Await pathology results.                        - Repeat colonoscopy in 5 years for  surveillance.                        - Return to GI office PRN.                        - The findings and recommendations were discussed with                         the patient. Procedure Code(s):     --- Professional ---                        [phone], Colonoscopy, flexible; with removal of                         tumor(s), polyp(s), or other lesion(s) by snare                         technique Diagnosis Code(s):     --- Professional ---                        K57.30, Diverticulosis of large intestine without                         perforation or abscess without bleeding                        K63.5, Polyp of colon                        K64.0, First degree hemorrhoids                        Z12.11, Encounter for screening for malignant neoplasm                         of colon CPT copyright 2019 American Medical Association. All rights reserved. The codes documented in this report are preliminary and upon coder review may  be revised to meet current compliance requirements. Efrain Sella MD, MD 09/20/2019 1:23:41 PM This report has been signed electronically. Number of Addenda: 0 Note Initiated On: 09/20/2019 12:39 PM Scope Withdrawal Time: 0 hours 8 minutes 26 seconds  Total Procedure Duration: 0 hours 14 minutes 17 seconds  Estimated Blood Loss:  Estimated blood loss: none.      [facility]

## 2019-09-20 NOTE — Anesthesia Preprocedure Evaluation (Signed)
Anesthesia Evaluation  Patient identified by MRN, date of birth, ID band Patient awake    Reviewed: Allergy & Precautions, NPO status , Patient's Chart, lab work & pertinent test results  History of Anesthesia Complications Negative for: history of anesthetic complications  Airway Mallampati: II  TM Distance: >3 FB Neck ROM: Full    Dental no notable dental hx.    Pulmonary neg pulmonary ROS, neg sleep apnea, neg COPD,    breath sounds clear to auscultation- rhonchi (-) wheezing      Cardiovascular Exercise Tolerance: Good (-) hypertension(-) CAD, (-) Past MI, (-) Cardiac Stents and (-) CABG  Rhythm:Regular Rate:Normal - Systolic murmurs and - Diastolic murmurs    Neuro/Psych neg Seizures negative neurological ROS  negative psych ROS   GI/Hepatic Neg liver ROS, hiatal hernia, GERD  ,  Endo/Other  negative endocrine ROSneg diabetes  Renal/GU negative Renal ROS     Musculoskeletal negative musculoskeletal ROS (+)   Abdominal (+) - obese,   Peds  Hematology negative hematology ROS (+)   Anesthesia Other Findings Past Medical History: No date: Diverticulitis No date: Epigastric pain     Comment:  CHRONIC No date: GERD (gastroesophageal reflux disease) No date: Hiatal hernia   Reproductive/Obstetrics                             Anesthesia Physical Anesthesia Plan  ASA: II  Anesthesia Plan: General   Post-op Pain Management:    Induction: Intravenous  PONV Risk Score and Plan: 1 and Propofol infusion  Airway Management Planned: Natural Airway  Additional Equipment:   Intra-op Plan:   Post-operative Plan:   Informed Consent: I have reviewed the patients History and Physical, chart, labs and discussed the procedure including the risks, benefits and alternatives for the proposed anesthesia with the patient or authorized representative who has indicated his/her understanding and  acceptance.     Dental advisory given  Plan Discussed with: CRNA and Anesthesiologist  Anesthesia Plan Comments:         Anesthesia Quick Evaluation

## 2019-09-20 NOTE — Interval H&P Note (Signed)
History and Physical Interval Note:  09/20/2019 12:59 PM  Willeen Niece  has presented today for surgery, with the diagnosis of COLON CANCER SCREENING.  The various methods of treatment have been discussed with the patient and family. After consideration of risks, benefits and other options for treatment, the patient has consented to  Procedure(s): COLONOSCOPY WITH PROPOFOL (N/A) as a surgical intervention.  The patient's history has been reviewed, patient examined, no change in status, stable for surgery.  I have reviewed the patient's chart and labs.  Questions were answered to the patient's satisfaction.     [place], [place]

## 2019-09-20 NOTE — Transfer of Care (Signed)
Immediate Anesthesia Transfer of Care Note  Patient: Carl Mckenzie  Procedure(s) Performed: COLONOSCOPY WITH PROPOFOL (N/A )  Patient Location: PACU  Anesthesia Type:General  Level of Consciousness: awake and alert   Airway & Oxygen Therapy: Patient Spontanous Breathing and Patient connected to nasal cannula oxygen  Post-op Assessment: Report given to RN and Post -op Vital signs reviewed and stable  Post vital signs: Reviewed and stable  Last Vitals:  Vitals Value Taken Time  BP 103/68 09/20/19 1325  Temp 36.4 C 09/20/19 1325  Pulse 49 09/20/19 1326  Resp 14 09/20/19 1326  SpO2 95 % 09/20/19 1326  Vitals shown include unvalidated device data.  Last Pain:  Vitals:   09/20/19 1325  TempSrc:   PainSc: Asleep         Complications: No apparent anesthesia complications

## 2019-09-20 NOTE — Anesthesia Postprocedure Evaluation (Signed)
Anesthesia Post Note  Patient: Carl Mckenzie  Procedure(s) Performed: COLONOSCOPY WITH PROPOFOL (N/A )  Patient location during evaluation: Endoscopy Anesthesia Type: General Level of consciousness: awake and alert and oriented Pain management: pain level controlled Vital Signs Assessment: post-procedure vital signs reviewed and stable Respiratory status: spontaneous breathing, nonlabored ventilation and respiratory function stable Cardiovascular status: blood pressure returned to baseline and stable Postop Assessment: no signs of nausea or vomiting Anesthetic complications: no     Last Vitals:  Vitals:   09/20/19 1345 09/20/19 1355  BP: 129/88 121/89  Pulse: (!) 48 (!) 54  Resp: 14 11  Temp:    SpO2: 96% 98%    Last Pain:  Vitals:   09/20/19 1355  TempSrc:   PainSc: 0-No pain                 Lenville Hibberd

## 2019-09-20 NOTE — Interval H&P Note (Signed)
History and Physical Interval Note:  09/20/2019 12:59 PM  Carl Mckenzie  has presented today for surgery, with the diagnosis of COLON CANCER SCREENING.  The various methods of treatment have been discussed with the patient and family. After consideration of risks, benefits and other options for treatment, the patient has consented to  Procedure(s): COLONOSCOPY WITH PROPOFOL (N/A) as a surgical intervention.  The patient's history has been reviewed, patient examined, no change in status, stable for surgery.  I have reviewed the patient's chart and labs.  Questions were answered to the patient's satisfaction.     [place], [place]

## 2019-09-21 ENCOUNTER — Encounter: Payer: Self-pay | Admitting: *Deleted

## 2019-09-21 LAB — SURGICAL PATHOLOGY

## 2021-11-30 ENCOUNTER — Emergency Department: Payer: MEDICARE

## 2021-11-30 ENCOUNTER — Emergency Department
Admission: EM | Admit: 2021-11-30 | Discharge: 2021-11-30 | Disposition: A | Discharge status: Home / Self Care | Payer: MEDICARE | Attending: Emergency Medicine | Admitting: Emergency Medicine

## 2021-11-30 ENCOUNTER — Other Ambulatory Visit: Payer: Self-pay

## 2021-11-30 DIAGNOSIS — I10 Essential (primary) hypertension: Secondary | ICD-10-CM | POA: Diagnosis not present

## 2021-11-30 DIAGNOSIS — R519 Headache, unspecified: Secondary | ICD-10-CM | POA: Diagnosis present

## 2021-11-30 LAB — COMPREHENSIVE METABOLIC PANEL
ALT: 23 U/L (ref 0–44)
AST: 27 U/L (ref 15–41)
Albumin: 4.2 g/dL (ref 3.5–5.0)
Alkaline Phosphatase: 88 U/L (ref 38–126)
Anion gap: 8 (ref 5–15)
BUN: 16 mg/dL (ref 8–23)
CO2: 26 mmol/L (ref 22–32)
Calcium: 9.1 mg/dL (ref 8.9–10.3)
Chloride: 108 mmol/L (ref 98–111)
Creatinine, Ser: 1.03 mg/dL (ref 0.61–1.24)
GFR, Estimated: >60 mL/min (ref >60)
Glucose, Bld: 103 mg/dL — ABNORMAL HIGH (ref 70–99)
Potassium: 4.3 mmol/L (ref 3.5–5.1)
Sodium: 142 mmol/L (ref 135–145)
Total Bilirubin: 1.8 mg/dL — ABNORMAL HIGH (ref 0.3–1.2)
Total Protein: 7.1 g/dL (ref 6.5–8.1)

## 2021-11-30 LAB — CBC WITH DIFFERENTIAL/PLATELET
Abs Immature Granulocytes: 0.01 10*3/uL (ref 0.00–0.07)
Basophils Absolute: 0.1 10*3/uL (ref 0.0–0.1)
Basophils Relative: 1 %
Eosinophils Absolute: 0.1 10*3/uL (ref 0.0–0.5)
Eosinophils Relative: 1 %
HCT: 44.3 % (ref 39.0–52.0)
Hemoglobin: 14.8 g/dL (ref 13.0–17.0)
Immature Granulocytes: 0 %
Lymphocytes Relative: 31 %
Lymphs Abs: 2.2 10*3/uL (ref 0.7–4.0)
MCH: 28.8 pg (ref 26.0–34.0)
MCHC: 33.4 g/dL (ref 30.0–36.0)
MCV: 86.4 fL (ref 80.0–100.0)
Monocytes Absolute: 0.7 10*3/uL (ref 0.1–1.0)
Monocytes Relative: 10 %
Neutro Abs: 3.8 10*3/uL (ref 1.7–7.7)
Neutrophils Relative %: 57 %
Platelets: 212 10*3/uL (ref 150–400)
RBC: 5.13 MIL/uL (ref 4.22–5.81)
RDW: 12.5 % (ref 11.5–15.5)
WBC: 6.8 10*3/uL (ref 4.0–10.5)
nRBC: 0 % (ref 0.0–0.2)

## 2021-11-30 MED ORDER — HYDROCHLOROTHIAZIDE 25 MG PO TABS: 25.0000 mg | ORAL_TABLET | Freq: Every day | ORAL | 0 refills | Status: AC

## 2021-11-30 NOTE — ED Triage Notes (Signed)
Pt states has been having headaches, pt states he took his blood pressure and it was elevated. Pt states has been having intermittent headache for weeks. Pt is 200/100 in left arm and 196/93 in right arm. Pt denies other symtoms.

## 2021-11-30 NOTE — ED Provider Notes (Signed)
[facility] Provider Note    Event Date/Time   First MD Initiated Contact with Patient 11/30/21 2035     (approximate)   History   No chief complaint on file.   HPI  Carl Mckenzie is a 66 y.o. male  with pmh borderline HTN who presents with headache and elevated blood pressure.  Patient notes for the past 3 weeks has had pretty much daily headache is bitemporal comes and goes not worse in the morning or evening not associate with vomiting or any neurologic symptoms including visual change.  His neighbor checked his blood pressure this afternoon when he complained of headache and it was elevated.  Patient's been told that his blood pressure was borderline in the past but is not on anything for it.  Does not routinely check his blood pressure denies chest pain or shortness of breath nausea vomiting.    Past Medical History:  Diagnosis Date   Diverticulitis    Epigastric pain    CHRONIC   GERD (gastroesophageal reflux disease)    Hiatal hernia     There are no problems to display for this patient.    Physical Exam  Triage Vital Signs: ED Triage Vitals  Enc Vitals Group     BP 11/30/21 2017 (!) 200/101     Pulse Rate 11/30/21 2017 (!) 48     Resp 11/30/21 2017 16     Temp 11/30/21 2017 98.2 F (36.8 C)     Temp Source 11/30/21 2017 Oral     SpO2 11/30/21 2017 100 %     Weight 11/30/21 2018 200 lb (90.7 kg)     Height 11/30/21 2018 '5\' 9"'$  (1.753 m)     Head Circumference --      Peak Flow --      Pain Score 11/30/21 2018 5     Pain Loc --      Pain Edu? --      Excl. in [place]? --     Most recent vital signs: Vitals:   11/30/21 2038 11/30/21 2200  BP: (!) 206/97 (!) 191/91  Pulse: (!) 47 (!) 48  Resp:  17  Temp:    SpO2: 99% 97%     General: Awake, no distress.  CV:  Good peripheral perfusion.  Resp:  Normal effort.  Abd:  No distention.  Neuro:             Awake, Alert, Oriented x 3  Other:  Aox3, nml speech  PERRL, EOMI, face  symmetric, nml tongue movement  5/5 strength in the BL upper and lower extremities  Sensation grossly intact in the BL upper and lower extremities  Finger-nose-finger intact BL    ED Results / Procedures / Treatments  Labs (all labs ordered are listed, but only abnormal results are displayed) Labs Reviewed  COMPREHENSIVE METABOLIC PANEL - Abnormal; Notable for the following components:      Result Value   Glucose, Bld 103 (*)    Total Bilirubin 1.8 (*)    All other components within normal limits  CBC WITH DIFFERENTIAL/PLATELET     EKG     RADIOLOGY I reviewed and interpreted the CT scan of the brain which does not show any acute intracranial process    PROCEDURES:  Critical Care performed: No  Procedures  The patient is on the cardiac monitor to evaluate for evidence of arrhythmia and/or significant heart rate changes.   MEDICATIONS ORDERED IN ED: Medications - No data  to display   IMPRESSION / MDM / [place] / ED COURSE  I reviewed the triage vital signs and the nursing notes.                              Patient's presentation is most consistent with acute presentation with potential threat to life or bodily function.  Differential diagnosis includes, but is not limited to, asymptomatic hypertension, brain mass, migraine headache  Patient is a 66 year old male presents with several except headache and blood pressure that was elevated today.  He does not.  Formal diagnosis of hypertension.  He has been borderline in the past does not take anything for it.  He is hypertensive here in the 016W systolic.  His headache is not associate with neurologic symptoms not associate with vomiting or worse in the morning.  Given new headache in this 66 year old male I did obtain a CT head to rule out mass or other space-occupying lesion, this is negative.  CBC and CMP are reassuring.  Is not having chest pain back pain dyspnea to suggest hypertensive emergency will  start patient on HCTZ.  City purchase a blood pressure cuff and check his blood pressure daily and follow-up with his PCP.       FINAL CLINICAL IMPRESSION(S) / ED DIAGNOSES   Final diagnoses:  Hypertension, unspecified type     Rx / DC Orders   ED Discharge Orders          Ordered    hydrochlorothiazide (HYDRODIURIL) 25 MG tablet  Daily        11/30/21 2221             Note:  This document was prepared using Dragon voice recognition software and may include unintentional dictation errors.   Rada Hay, MD 12/01/21 Carl Mckenzie

## 2021-11-30 NOTE — Discharge Instructions (Addendum)
Your blood pressure is elevated.  Please start the HCTZ once daily for the next 30 days.  Please purchase a blood pressure cuff and start measuring your blood pressure about once per day and keep record of this to follow-up with your primary care doctor.

## 2022-01-13 ENCOUNTER — Ambulatory Visit: Payer: Medicare HMO | Admitting: Podiatry

## 2022-01-13 DIAGNOSIS — L600 Ingrowing nail: Secondary | ICD-10-CM

## 2022-01-13 MED ORDER — GENTAMICIN SULFATE 0.1 % EX CREA: 1.0000 | TOPICAL_CREAM | Freq: Two times a day (BID) | CUTANEOUS | 1 refills | Status: AC

## 2022-01-13 MED ORDER — DOXYCYCLINE HYCLATE 100 MG PO TABS: 100.0000 mg | ORAL_TABLET | Freq: Two times a day (BID) | ORAL | 0 refills | Status: AC

## 2022-01-13 NOTE — Progress Notes (Signed)
   Chief Complaint  Patient presents with   Ingrown Toenail    Patient is here for left foot ingrown toenail.    Subjective: Patient presents today for evaluation of pain to the medial border of the left great toe as well as the medial border of the left second toe that has been present for years intermittently.. Patient is concerned for possible ingrown nail.  It is very sensitive to touch.  Patient presents today for further treatment and evaluation.  Past Medical History:  Diagnosis Date   Diverticulitis    Epigastric pain    CHRONIC   GERD (gastroesophageal reflux disease)    Hiatal hernia     Objective:  General: Well developed, nourished, in no acute distress, alert and oriented x3   Dermatology: Skin is warm, dry and supple bilateral.  Medial border left great toe and medial border left second toe is tender with evidence of an ingrowing nail. Pain on palpation noted to the border of the nail fold. The remaining nails appear unremarkable at this time. There are no open sores, lesions.  Vascular: DP and PT pulses palpable.  No clinical evidence of vascular compromise  Neruologic: Grossly intact via light touch bilateral.  Musculoskeletal: No pedal deformity noted  Assesement: #1 Paronychia with ingrowing nail medial border left great toe and medial border left second toe  Plan of Care:  1. Patient evaluated.  2. Discussed treatment alternatives and plan of care. Explained nail avulsion procedure and post procedure course to patient. 3. Patient opted for permanent partial nail avulsion of the ingrown portion of the nail.  4. Prior to procedure, local anesthesia infiltration utilized using 3 ml of a 50:50 mixture of 2% plain lidocaine and 0.5% plain marcaine in a normal hallux block fashion and a betadine prep performed.  5. Partial permanent nail avulsion with chemical matrixectomy performed using [id number] applications of phenol followed by alcohol flush.  6. Light dressing  applied.  Post care instructions provided 7.  Prescription for gentamicin 2% cream  8.  Prescription for doxycycline 100 mg 2 times daily #20 for prophylaxis  9.  Return to clinic 2 weeks.  *[facility]  Edrick Kins, DPM [facility]  Dr. Edrick Kins, DPM    [address], [place] [address]                Office [phone]  Fax [phone]

## 2022-02-06 ENCOUNTER — Ambulatory Visit (INDEPENDENT_AMBULATORY_CARE_PROVIDER_SITE_OTHER): Payer: Medicare HMO | Admitting: Podiatry

## 2022-02-06 DIAGNOSIS — L600 Ingrowing nail: Secondary | ICD-10-CM | POA: Diagnosis not present

## 2022-02-06 NOTE — Progress Notes (Signed)
   Chief Complaint  Patient presents with   nail check    Patient is here for nail check for ingrown removal left 2nd toe.    Subjective: 65 y.o. male presents today status post permanent nail avulsion procedure of the medial border left great toe and medial border left second toe that was performed on 01/13/2022.  Patient states that he is improving.  He did not soak his foot but he has been applying the antibiotic cream.  Overall improvement.  Noted complaints at this time  Past Medical History:  Diagnosis Date   Diverticulitis    Epigastric pain    CHRONIC   GERD (gastroesophageal reflux disease)    Hiatal hernia     Objective: Neurovascular status intact.  Skin is warm, dry and supple. Nail and respective nail fold appears to be healing appropriately.   Assessment: #1 s/p partial permanent nail matrixectomy medial border left great toe.  Medial border left second toe   Plan of care: #1 patient was evaluated  #2 light debridement of the periungual debris was performed to the border of the respective toe and nail plate using a tissue nipper. #3 patient is to return to clinic on a PRN basis.   Edrick Kins, DPM [facility]  Dr. Edrick Kins, DPM    [address],  [address]                Office [phone]  Fax [phone]

## 2022-07-13 ENCOUNTER — Other Ambulatory Visit: Payer: Self-pay | Admitting: Family Medicine

## 2022-07-13 DIAGNOSIS — Z9189 Other specified personal risk factors, not elsewhere classified: Secondary | ICD-10-CM

## 2022-07-13 DIAGNOSIS — E7800 Pure hypercholesterolemia, unspecified: Secondary | ICD-10-CM

## 2022-07-13 DIAGNOSIS — Z0000 Encounter for general adult medical examination without abnormal findings: Secondary | ICD-10-CM

## 2022-07-24 ENCOUNTER — Ambulatory Visit
Admission: RE | Admit: 2022-07-24 | Discharge: 2022-07-24 | Disposition: A | Discharge status: Home / Self Care | Payer: Self-pay | Source: Ambulatory Visit | Attending: Family Medicine | Admitting: Family Medicine

## 2022-07-24 DIAGNOSIS — E7800 Pure hypercholesterolemia, unspecified: Secondary | ICD-10-CM | POA: Insufficient documentation

## 2022-07-24 DIAGNOSIS — Z9189 Other specified personal risk factors, not elsewhere classified: Secondary | ICD-10-CM | POA: Insufficient documentation

## 2022-07-24 DIAGNOSIS — Z0000 Encounter for general adult medical examination without abnormal findings: Secondary | ICD-10-CM | POA: Insufficient documentation
